# Patient Record
Sex: Male | Born: 1980 | Race: White | Hispanic: No | Marital: Married | Smoking: Current every day smoker
Health system: Southern US, Community
[De-identification: ages and names within clinical notes are randomized; demographics above are authoritative.]

---

## 2012-10-11 ENCOUNTER — Emergency Department (HOSPITAL_COMMUNITY)
Admission: EM | Admit: 2012-10-11 | Discharge: 2012-10-11 | Disposition: A | Discharge status: Home / Self Care | Payer: BC Managed Care – PPO | Attending: Emergency Medicine | Admitting: Emergency Medicine

## 2012-10-11 ENCOUNTER — Encounter (HOSPITAL_COMMUNITY): Payer: Self-pay | Admitting: Emergency Medicine

## 2012-10-11 ENCOUNTER — Emergency Department (HOSPITAL_COMMUNITY): Payer: BC Managed Care – PPO

## 2012-10-11 DIAGNOSIS — Z79899 Other long term (current) drug therapy: Secondary | ICD-10-CM | POA: Insufficient documentation

## 2012-10-11 DIAGNOSIS — M7020 Olecranon bursitis, unspecified elbow: Secondary | ICD-10-CM | POA: Insufficient documentation

## 2012-10-11 DIAGNOSIS — M7022 Olecranon bursitis, left elbow: Secondary | ICD-10-CM

## 2012-10-11 DIAGNOSIS — F17200 Nicotine dependence, unspecified, uncomplicated: Secondary | ICD-10-CM | POA: Insufficient documentation

## 2012-10-11 MED ORDER — IBUPROFEN 800 MG PO TABS: 800.0000 mg | ORAL_TABLET | Freq: Three times a day (TID) | ORAL | Status: DC | PRN

## 2012-10-11 MED ORDER — HYDROCODONE-ACETAMINOPHEN 5-325 MG PO TABS: 1.0000 | ORAL_TABLET | Freq: Four times a day (QID) | ORAL | Status: DC | PRN

## 2012-10-11 NOTE — ED Notes (Signed)
PT states that at about 2 AM he woke up and his elbow was red and tender. PT rates pain as 10/10.  PT states that his fingers and hand is "tingly" and numb. Pt is able to move fingers and wrist and elbow joint in full ROM but with 10/10 pain. Pt states that he was at the urgent care on [address] and they told him to come here for antibiotics.

## 2012-10-11 NOTE — ED Provider Notes (Signed)
CSN: [id number]     Arrival date & time 10/11/12  1648 History   First MD Initiated Contact with Patient 10/11/12 1750     Chief Complaint  Patient presents with  . Elbow Pain   (Consider location/radiation/quality/duration/timing/severity/associated sxs/prior Treatment) HPI The patient presents to the emergency department with left for pain and swelling that began 2 days ago. Patient states he had taken a two-week break from lifting weights and lift weights  on Thursday the patient, states, that he did not have any fall or direct blow to the elbow.  Patient, states, that he has not had a fever, nausea, vomiting, dizziness, weakness, numbness, headache, blurred vision, chest pain, shortness of breath, or wounds.  The patient, states, that he was seen at an urgent care and sent here for concerns with joint infection History reviewed. No pertinent past medical history. History reviewed. No pertinent past surgical history. History reviewed. No pertinent family history. History  Substance Use Topics  . Smoking status: Current Every Day Smoker  . Smokeless tobacco: Not on file  . Alcohol Use: No    Review of Systems All other systems negative except as documented in the HPI. All pertinent positives and negatives as reviewed in the HPI. Allergies  Review of patient's allergies indicates no known allergies.  Home Medications   Current Outpatient Rx  Name  Route  Sig  Dispense  Refill  . amphetamine-dextroamphetamine (ADDERALL XR) 20 MG 24 hr capsule   Oral   Take 20 mg by mouth daily.         . Aspirin-Acetaminophen-Caffeine (GOODY HEADACHE PO)   Oral   Take 1 packet by mouth daily as needed (for pain).         . calcium carbonate (TUMS - DOSED IN MG ELEMENTAL CALCIUM) 500 MG chewable tablet   Oral   Chew 1 tablet by mouth daily as needed for heartburn.         Marland Kitchen ibuprofen (ADVIL,MOTRIN) 200 MG tablet   Oral   Take 400 mg by mouth every 6 (six) hours as needed for pain.           BP 134/79  Pulse 80  Temp(Src) 98 F (36.7 C) (Oral)  Resp 20  SpO2 97% Physical Exam  Nursing note and vitals reviewed. Constitutional: He appears well-developed and well-nourished. No distress.  HENT:  Head: Normocephalic and atraumatic.  Pulmonary/Chest: Effort normal.  Musculoskeletal:       Left elbow: He exhibits swelling and effusion. He exhibits normal range of motion, no deformity and no laceration. Tenderness found. Olecranon process tenderness noted.  Patient has redness and swelling over the olecranon bursa patient does not have any wounds or swelling to any other part of the elbow joint.  Patient has full range of motion of the elbow without significant difficulty.  Patient does complain it hurts to move the elbow, but is able to do so  Skin: Skin is warm and dry. No rash noted. There is erythema.    ED Course  Procedures (including critical care time) Labs Review Labs Reviewed - No data to display Imaging Review Dg Elbow Complete Left  10/11/2012   CLINICAL DATA:  Left elbow pain/swelling  EXAM: LEFT ELBOW - COMPLETE 3+ VIEW  COMPARISON:  None.  FINDINGS: No fracture or dislocation is seen.  The joint spaces are preserved.  No displaced quadrant fat pad suggests an elbow joint effusion.  Soft tissue swelling overlying the olecranon, suggesting olecranon bursitis.  IMPRESSION: No fracture  or dislocation is seen.  Soft tissue swelling overlying the olecranon, suggesting olecranon bursitis.   Electronically Signed   By: Charline Bills M.D.   On: 10/11/2012 18:14   Based on the patient's physical exam history of present illness.  He most likely has olecranon bursitis.  The patient is advised to return here as needed and given him followup with orthopedics.  Told to ice the, elbow.  He'll be placed in an Ace wrap, as well.  Patient is not exhibiting any signs of septic joint. MDM      Carlyle Dolly, PA-C 10/11/12 [phone]

## 2012-10-11 NOTE — ED Notes (Signed)
Patient transported to X-ray 

## 2012-10-11 NOTE — ED Notes (Signed)
Pt here with left elbow redness and swelling starting this am at 1am

## 2012-10-11 NOTE — ED Provider Notes (Signed)
Medical screening examination/treatment/procedure(s) were performed by non-physician practitioner and as supervising physician I was immediately available for consultation/collaboration.   Charles B. Bernette Mayers, MD 10/11/12 [id number]

## 2013-04-15 ENCOUNTER — Encounter (HOSPITAL_BASED_OUTPATIENT_CLINIC_OR_DEPARTMENT_OTHER): Payer: Self-pay | Admitting: Emergency Medicine

## 2013-04-15 ENCOUNTER — Emergency Department (HOSPITAL_BASED_OUTPATIENT_CLINIC_OR_DEPARTMENT_OTHER)
Admission: EM | Admit: 2013-04-15 | Discharge: 2013-04-15 | Disposition: A | Discharge status: Home / Self Care | Payer: Worker's Compensation | Attending: Emergency Medicine | Admitting: Emergency Medicine

## 2013-04-15 DIAGNOSIS — Y9289 Other specified places as the place of occurrence of the external cause: Secondary | ICD-10-CM | POA: Insufficient documentation

## 2013-04-15 DIAGNOSIS — Y9389 Activity, other specified: Secondary | ICD-10-CM | POA: Insufficient documentation

## 2013-04-15 DIAGNOSIS — Z79899 Other long term (current) drug therapy: Secondary | ICD-10-CM | POA: Insufficient documentation

## 2013-04-15 DIAGNOSIS — Z791 Long term (current) use of non-steroidal anti-inflammatories (NSAID): Secondary | ICD-10-CM | POA: Insufficient documentation

## 2013-04-15 DIAGNOSIS — S0500XA Injury of conjunctiva and corneal abrasion without foreign body, unspecified eye, initial encounter: Secondary | ICD-10-CM

## 2013-04-15 DIAGNOSIS — W208XXA Other cause of strike by thrown, projected or falling object, initial encounter: Secondary | ICD-10-CM | POA: Insufficient documentation

## 2013-04-15 DIAGNOSIS — F17200 Nicotine dependence, unspecified, uncomplicated: Secondary | ICD-10-CM | POA: Insufficient documentation

## 2013-04-15 DIAGNOSIS — S058X9A Other injuries of unspecified eye and orbit, initial encounter: Secondary | ICD-10-CM | POA: Insufficient documentation

## 2013-04-15 MED ORDER — TETRACAINE HCL 0.5 % OP SOLN
2.0000 [drp] | Freq: Once | OPHTHALMIC | Status: AC
  Administered 2013-04-15: 2 [drp] via OPHTHALMIC

## 2013-04-15 MED ORDER — NAPROXEN 500 MG PO TABS: 500.0000 mg | ORAL_TABLET | Freq: Two times a day (BID) | ORAL | Status: DC

## 2013-04-15 MED ORDER — FLUORESCEIN SODIUM 1 MG OP STRP
ORAL_STRIP | OPHTHALMIC | Status: AC
  Filled 2013-04-15: qty 1

## 2013-04-15 MED ORDER — FLUORESCEIN SODIUM 1 MG OP STRP
1.0000 | ORAL_STRIP | Freq: Once | OPHTHALMIC | Status: AC
  Administered 2013-04-15: 1 via OPHTHALMIC

## 2013-04-15 MED ORDER — TETRACAINE HCL 0.5 % OP SOLN
OPHTHALMIC | Status: AC
  Filled 2013-04-15: qty 2

## 2013-04-15 MED ORDER — CIPROFLOXACIN HCL 0.3 % OP SOLN: 2.0000 [drp] | OPHTHALMIC | Status: DC

## 2013-04-15 NOTE — ED Provider Notes (Signed)
CSN: [id number]     Arrival date & time 04/15/13  0541 History   First MD Initiated Contact with Patient 04/15/13 [phone]     Chief Complaint  Patient presents with  . Eye Pain     (Consider location/radiation/quality/duration/timing/severity/associated sxs/prior Treatment) Patient is a 33 y.o. male presenting with eye pain. The history is provided by the patient. No language interpreter was used.  Eye Pain This is a new problem. The current episode started 3 to 5 hours ago. The problem occurs constantly. The problem has not changed since onset.Pertinent negatives include no chest pain, no abdominal pain, no headaches and no shortness of breath. Nothing aggravates the symptoms. Nothing relieves the symptoms. He has tried nothing for the symptoms. The treatment provided no relief.    History reviewed. No pertinent past medical history. History reviewed. No pertinent past surgical history. History reviewed. No pertinent family history. History  Substance Use Topics  . Smoking status: Current Every Day Smoker  . Smokeless tobacco: Not on file  . Alcohol Use: No    Review of Systems  Eyes: Positive for pain. Negative for discharge and visual disturbance.  Respiratory: Negative for shortness of breath.   Cardiovascular: Negative for chest pain.  Gastrointestinal: Negative for abdominal pain.  Neurological: Negative for headaches.  All other systems reviewed and are negative.      Allergies  Review of patient's allergies indicates no known allergies.  Home Medications   Current Outpatient Rx  Name  Route  Sig  Dispense  Refill  . amphetamine-dextroamphetamine (ADDERALL XR) 20 MG 24 hr capsule   Oral   Take 20 mg by mouth daily.         . Aspirin-Acetaminophen-Caffeine (GOODY HEADACHE PO)   Oral   Take 1 packet by mouth daily as needed (for pain).         . calcium carbonate (TUMS - DOSED IN MG ELEMENTAL CALCIUM) 500 MG chewable tablet   Oral   Chew 1 tablet by mouth  daily as needed for heartburn.         . ciprofloxacin (CILOXAN) 0.3 % ophthalmic solution   Right Eye   Place 2 drops into the right eye every 4 (four) hours while awake. Administer 1 drop, every 2 hours, while awake, for 2 days. Then 1 drop, every 4 hours, while awake, for the next 5 days.   5 mL   0   . HYDROcodone-acetaminophen (NORCO/VICODIN) 5-325 MG per tablet   Oral   Take 1 tablet by mouth every 6 (six) hours as needed for pain.   20 tablet   0   . ibuprofen (ADVIL,MOTRIN) 200 MG tablet   Oral   Take 400 mg by mouth every 6 (six) hours as needed for pain.         Marland Kitchen ibuprofen (ADVIL,MOTRIN) 800 MG tablet   Oral   Take 1 tablet (800 mg total) by mouth every 8 (eight) hours as needed for pain.   21 tablet   0   . naproxen (NAPROSYN) 500 MG tablet   Oral   Take 1 tablet (500 mg total) by mouth 2 (two) times daily.   30 tablet   0    BP 135/87  Pulse 92  Temp(Src) 98 F (36.7 C) (Oral)  Resp 18  Ht 6' (1.829 m)  Wt 185 lb (83.915 kg)  BMI 25.08 kg/m2 Physical Exam  Constitutional: He is oriented to person, place, and time. He appears well-developed and well-nourished. No distress.  HENT:  Head: Normocephalic and atraumatic.  Mouth/Throat: Oropharynx is clear and moist.  Eyes: EOM are normal. Pupils are equal, round, and reactive to light. Right eye exhibits no chemosis. Left eye exhibits no chemosis.  Slit lamp exam:      The right eye shows corneal abrasion. The right eye shows no foreign body and no hyphema.       The left eye shows no corneal abrasion.  Neck: Normal range of motion. Neck supple.  Cardiovascular: Normal rate and regular rhythm.   Pulmonary/Chest: Effort normal and breath sounds normal. He has no wheezes.  Abdominal: Bowel sounds are normal. He exhibits no distension.  Musculoskeletal: Normal range of motion.  Neurological: He is alert and oriented to person, place, and time.  Skin: Skin is warm and dry.  Psychiatric: He has a normal  mood and affect.    ED Course  Procedures (including critical care time) Labs Review Labs Reviewed - No data to display Imaging Review No results found.   EKG Interpretation None      MDM   Final diagnoses:  Corneal abrasion  Corneal abrasion Tetanus UTD  Cipro eye drops q 4 hrs while awake x 7 days and follow up in 24 hours with Gwen PoundsKowalski or ophtho.      Jasmine AweApril K Shandra Szymborski-Rasch, MD 04/15/13 [phone]

## 2013-04-15 NOTE — Discharge Instructions (Signed)
Corneal Abrasion  The cornea is the clear covering at the front and center of the eye. When looking at the colored portion of the eye (iris), you are looking through the cornea. This very thin tissue is made up of many layers. The surface layer is a single layer of cells (corneal epithelium) and is one of the most sensitive tissues in the body. If a scratch or injury causes the corneal epithelium to come off, it is called a corneal abrasion. If the injury extends to the tissues below the epithelium, the condition is called a corneal ulcer.  CAUSES    Scratches.   Trauma.   Foreign body in the eye.  Some people have recurrences of abrasions in the area of the original injury even after it has healed (recurrent erosion syndrome). Recurrent erosion syndrome generally improves and goes away with time.  SYMPTOMS    Eye pain.   Difficulty or inability to keep the injured eye open.   The eye becomes very sensitive to light.   Recurrent erosions tend to happen suddenly, first thing in the morning, usually after waking up and opening the eye.  DIAGNOSIS   Your health care provider can diagnose a corneal abrasion during an eye exam. Dye is usually placed in the eye using a drop or a small paper strip moistened by your tears. When the eye is examined with a special light, the abrasion shows up clearly because of the dye.  TREATMENT    Small abrasions may be treated with antibiotic drops or ointment alone.   Usually a pressure patch is specially applied. Pressure patches prevent the eye from blinking, allowing the corneal epithelium to heal. A pressure patch also reduces the amount of pain present in the eye during healing. Most corneal abrasions heal within 2 3 days with no effect on vision.  If the abrasion becomes infected and spreads to the deeper tissues of the cornea, a corneal ulcer can result. This is serious because it can cause corneal scarring. Corneal scars interfere with light passing through the cornea  and cause a loss of vision in the involved eye.  HOME CARE INSTRUCTIONS   Use medicine or ointment as directed. Only take over-the-counter or prescription medicines for pain, discomfort, or fever as directed by your health care provider.   Do not drive or operate machinery while your eye is patched. Your ability to judge distances is impaired.   If your health care provider has given you a follow-up appointment, it is very important to keep that appointment. Not keeping the appointment could result in a severe eye infection or permanent loss of vision. If there is any problem keeping the appointment, let your health care provider know.  SEEK MEDICAL CARE IF:    You have pain, light sensitivity, and a scratchy feeling in one eye or both eyes.   Your pressure patch keeps loosening up, and you can blink your eye under the patch after treatment.   Any kind of discharge develops from the eye after treatment or if the lids stick together in the morning.   You have the same symptoms in the morning as you did with the original abrasion days, weeks, or months after the abrasion healed.  MAKE SURE YOU:    Understand these instructions.   Will watch your condition.   Will get help right away if you are not doing well or get worse.  Document Released: 01/06/2000 Document Revised: 10/29/2012 Document Reviewed: 09/15/2012  [employer] Patient Information   2014 [employer]

## 2013-04-15 NOTE — ED Notes (Signed)
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

## 2013-08-24 ENCOUNTER — Emergency Department (HOSPITAL_COMMUNITY)
Admission: EM | Admit: 2013-08-24 | Discharge: 2013-08-24 | Disposition: A | Discharge status: Home / Self Care | Payer: Worker's Compensation | Attending: Emergency Medicine | Admitting: Emergency Medicine

## 2013-08-24 ENCOUNTER — Encounter (HOSPITAL_COMMUNITY): Payer: Self-pay | Admitting: Emergency Medicine

## 2013-08-24 DIAGNOSIS — Z791 Long term (current) use of non-steroidal anti-inflammatories (NSAID): Secondary | ICD-10-CM | POA: Insufficient documentation

## 2013-08-24 DIAGNOSIS — T148XXA Other injury of unspecified body region, initial encounter: Secondary | ICD-10-CM

## 2013-08-24 DIAGNOSIS — Z79899 Other long term (current) drug therapy: Secondary | ICD-10-CM | POA: Diagnosis not present

## 2013-08-24 DIAGNOSIS — W268XXA Contact with other sharp object(s), not elsewhere classified, initial encounter: Secondary | ICD-10-CM | POA: Diagnosis not present

## 2013-08-24 DIAGNOSIS — S71009A Unspecified open wound, unspecified hip, initial encounter: Secondary | ICD-10-CM | POA: Diagnosis present

## 2013-08-24 DIAGNOSIS — S7010XA Contusion of unspecified thigh, initial encounter: Secondary | ICD-10-CM | POA: Insufficient documentation

## 2013-08-24 DIAGNOSIS — S7011XA Contusion of right thigh, initial encounter: Secondary | ICD-10-CM

## 2013-08-24 DIAGNOSIS — F17200 Nicotine dependence, unspecified, uncomplicated: Secondary | ICD-10-CM | POA: Insufficient documentation

## 2013-08-24 DIAGNOSIS — S71109A Unspecified open wound, unspecified thigh, initial encounter: Secondary | ICD-10-CM | POA: Diagnosis not present

## 2013-08-24 DIAGNOSIS — Y990 Civilian activity done for income or pay: Secondary | ICD-10-CM | POA: Insufficient documentation

## 2013-08-24 DIAGNOSIS — Y9289 Other specified places as the place of occurrence of the external cause: Secondary | ICD-10-CM | POA: Insufficient documentation

## 2013-08-24 LAB — CBC WITH DIFFERENTIAL/PLATELET
Basophils Absolute: 0 10*3/uL (ref 0.0–0.1)
Basophils Relative: 0 % (ref 0–1)
EOS PCT: 3 % (ref 0–5)
Eosinophils Absolute: 0.4 10*3/uL (ref 0.0–0.7)
HCT: 46 % (ref 39.0–52.0)
HEMOGLOBIN: 16.3 g/dL (ref 13.0–17.0)
Lymphocytes Relative: 29 % (ref 12–46)
Lymphs Abs: 3.9 10*3/uL (ref 0.7–4.0)
MCH: 30.9 pg (ref 26.0–34.0)
MCHC: 35.4 g/dL (ref 30.0–36.0)
MCV: 87.1 fL (ref 78.0–100.0)
MONO ABS: 1.3 10*3/uL — AB (ref 0.1–1.0)
MONOS PCT: 10 % (ref 3–12)
NEUTROS ABS: 8 10*3/uL — AB (ref 1.7–7.7)
Neutrophils Relative %: 58 % (ref 43–77)
Platelets: 262 10*3/uL (ref 150–400)
RBC: 5.28 MIL/uL (ref 4.22–5.81)
RDW: 12.5 % (ref 11.5–15.5)
WBC: 13.7 10*3/uL — AB (ref 4.0–10.5)

## 2013-08-24 LAB — BASIC METABOLIC PANEL
Anion gap: 20 — ABNORMAL HIGH (ref 5–15)
BUN: 13 mg/dL (ref 6–23)
CHLORIDE: 98 meq/L (ref 96–112)
CO2: 20 mEq/L (ref 19–32)
Calcium: 10.3 mg/dL (ref 8.4–10.5)
Creatinine, Ser: 1.07 mg/dL (ref 0.50–1.35)
GFR calc Af Amer: >90 mL/min (ref >90)
GFR calc non Af Amer: >90 mL/min (ref >90)
GLUCOSE: 106 mg/dL — AB (ref 70–99)
Potassium: 4.1 mEq/L (ref 3.7–5.3)
Sodium: 138 mEq/L (ref 137–147)

## 2013-08-24 MED ORDER — SODIUM CHLORIDE 0.9 % IV BOLUS (SEPSIS)
1000.0000 mL | Freq: Once | INTRAVENOUS | Status: AC
  Administered 2013-08-24: 1000 mL via INTRAVENOUS

## 2013-08-24 MED ORDER — LORAZEPAM 2 MG/ML IJ SOLN
1.0000 mg | Freq: Once | INTRAMUSCULAR | Status: AC
  Administered 2013-08-24: 1 mg via INTRAVENOUS
  Filled 2013-08-24: qty 1

## 2013-08-24 MED ORDER — NAPROXEN 500 MG PO TABS: 500.0000 mg | ORAL_TABLET | Freq: Two times a day (BID) | ORAL | Status: DC

## 2013-08-24 MED ORDER — ONDANSETRON HCL 4 MG/2ML IJ SOLN
4.0000 mg | Freq: Once | INTRAMUSCULAR | Status: AC
  Administered 2013-08-24: 4 mg via INTRAVENOUS
  Filled 2013-08-24: qty 2

## 2013-08-24 MED ORDER — HYDROCODONE-ACETAMINOPHEN 5-325 MG PO TABS: ORAL_TABLET | ORAL | Status: DC

## 2013-08-24 NOTE — ED Provider Notes (Signed)
Medical screening examination/treatment/procedure(s) were performed by non-physician practitioner and as supervising physician I was immediately available for consultation/collaboration.  Flint MelterElliott L Rylee Huestis, MD 08/24/13 [phone]

## 2013-08-24 NOTE — ED Notes (Signed)
Declined W/C at D/C and was escorted to lobby by RN. 

## 2013-08-24 NOTE — ED Provider Notes (Signed)
CSN: [id number]     Arrival date & time 08/24/13  1117 History  This chart was scribed for non-physician practitioner, Rhea Bleacher, PA-C working with Flint Melter, MD, by Jarvis Morgan, ED Scribe. This patient was seen in room TR10C/TR10C and the patient's care was started at 12:02 PM.    Chief Complaint  Patient presents with  . Laceration    The history is provided by the patient. No language interpreter was used.   HPI Comments: Brandon Brock is a 33 y.o. male who presents to the Emergency Department complaining of laceration to the outer side of his right leg. 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 Patient works for [employer]. Patient is unsure whether the blade may have been dirty or had debris on it. Unsure on how deep the cut was. The laceration is actively bleeding but it currently controlled by gauze. He denies being on any blood thinners. Last t-dap was 1 year ago. He denies any dizziness, numbness or weakness to his right leg.   History reviewed. No pertinent past medical history. History reviewed. No pertinent past surgical history. No family history on file. History  Substance Use Topics  . Smoking status: Current Every Day Smoker  . Smokeless tobacco: Not on file  . Alcohol Use: No    Review of Systems  Constitutional: Negative for activity change.  Musculoskeletal: Positive for arthralgias. Negative for back pain, gait problem, joint swelling and neck pain.  Skin: Positive for wound (laceration to right leg, bleeding controlled by gauze).  Neurological: Negative for dizziness, weakness and numbness.    Allergies  Review of patient's allergies indicates no known allergies.  Home Medications   Prior to Admission medications   Medication Sig Start Date End Date Taking? Authorizing Provider  amphetamine-dextroamphetamine (ADDERALL XR) 20 MG 24 hr capsule Take 20 mg by mouth daily.    Historical Provider, MD   Aspirin-Acetaminophen-Caffeine (GOODY HEADACHE PO) Take 1 packet by mouth daily as needed (for pain).    Historical Provider, MD  calcium carbonate (TUMS - DOSED IN MG ELEMENTAL CALCIUM) 500 MG chewable tablet Chew 1 tablet by mouth daily as needed for heartburn.    Historical Provider, MD  ciprofloxacin (CILOXAN) 0.3 % ophthalmic solution Place 2 drops into the right eye every 4 (four) hours while awake. Administer 1 drop, every 2 hours, while awake, for 2 days. Then 1 drop, every 4 hours, while awake, for the next 5 days. 04/15/13   April Smitty Cords, MD  HYDROcodone-acetaminophen (NORCO/VICODIN) 5-325 MG per tablet Take 1 tablet by mouth every 6 (six) hours as needed for pain. 10/11/12   Jamesetta Orleans Lawyer, PA-C  ibuprofen (ADVIL,MOTRIN) 200 MG tablet Take 400 mg by mouth every 6 (six) hours as needed for pain.    Historical Provider, MD  ibuprofen (ADVIL,MOTRIN) 800 MG tablet Take 1 tablet (800 mg total) by mouth every 8 (eight) hours as needed for pain. 10/11/12   Jamesetta Orleans Lawyer, PA-C  naproxen (NAPROSYN) 500 MG tablet Take 1 tablet (500 mg total) by mouth 2 (two) times daily. 04/15/13   April K Palumbo-Rasch, MD   Triage Vitals:BP 144/84  Pulse 93  Temp(Src) 98.9 F (37.2 C) (Oral)  Resp 16  Ht 6\' 1"  (1.854 m)  Wt 190 lb (86.183 kg)  BMI 25.07 kg/m2  SpO2 97%  Physical Exam  Nursing note and vitals reviewed. Constitutional: He is oriented to person, place, and time. He appears well-developed  and well-nourished. No distress.  HENT:  Head: Normocephalic and atraumatic.  Eyes: Conjunctivae and EOM are normal.  Neck: Normal range of motion. Neck supple. No tracheal deviation present.  Cardiovascular: Normal rate and normal pulses.   Pulmonary/Chest: Effort normal. No respiratory distress.  Musculoskeletal: Normal range of motion. He exhibits tenderness. He exhibits no edema.  Neurological: He is alert and oriented to person, place, and time. No sensory deficit.  Motor,  sensation, and vascular distal to the injury is fully intact.   Skin: Skin is warm and dry.  1cm puncture wound noted R lateral thigh. There is moderate hematoma noted. No active bleeding. Wound extends approximately 1.5 cm in depth. No FB seen or palpated.   Psychiatric: He has a normal mood and affect. His behavior is normal.    ED Course  Procedures (including critical care time)  DIAGNOSTIC STUDIES: Oxygen Saturation is 97% on RA, normal by my interpretation.    COORDINATION OF CARE:  12:09 PM  LACERATION REPAIR Performed by: Trula SladeGeiple PA-C, Sykes PA-S2 Consent: Verbal consent obtained. Risks and benefits: risks, benefits and alternatives were discussed Patient identity confirmed: provided demographic data Time out performed prior to procedure Prepped and Draped in normal sterile fashion Wound explored Laceration Location: R lateral thigh Laceration Length: 1cm No Foreign Bodies seen or palpated Anesthesia: local infiltration Local anesthetic: lidocaine 2% with epinephrine Anesthetic total: 2.5 ml Irrigation method: syringe with splash guard, 750cc NS Amount of cleaning: standard Skin closure: 5-0 Ethilon Number of sutures or staples: 2 Technique: simple interrupted, loose Patient tolerance: Patient tolerated the procedure well with no immediate complications.  After starting wound cleaning and repair, patient had a vasovagal episode followed by a panic attack. Patient was breathing very heavily and had diaphoresis. At no point did he have evidence of angioedema or any signs of anaphylaxis. Patient was placed on a monitor and vital signs were monitored. Basic labs and fluids were ordered. Ativan and Zofran were given. Patient improved over 15 minutes.  Patient improved and wound repair was completed.  Patient counseled on wound care. Patient counseled on need to return or see PCP/urgent care for suture removal in 7 days. Patient was urged to return to the Emergency  Department urgently with worsening pain, swelling, expanding erythema especially if it streaks away from the affected area, fever, or if they have any other concerns. Patient verbalized understanding.   Patient counseled on use of narcotic pain medications. Counseled not to combine these medications with others containing tylenol. Urged not to drink alcohol, drive, or perform any other activities that requires focus while taking these medications. The patient verbalizes understanding and agrees with the plan.  Hematoma was monitored during a several hour ED stay. Hematoma did not expand and appears stable at current time. Wound dressed with a pressure bandage.    Labs Review Labs Reviewed - No data to display  Imaging Review No results found.   EKG Interpretation None      Vital signs reviewed and are as follows: Filed Vitals:   08/24/13 1350  BP: 122/76  Pulse: 75  Temp: 98.8 F (37.1 C)  Resp: 16     MDM   Final diagnoses:  Puncture wound  Hematoma of thigh, right, initial encounter   Puncture wound: Wound explored and repaired. No foreign bodies visualized or palpated. Tetanus is up-to-date.  Hematoma: Tender and painful, however stable during ED stay. Hematoma was monitored for several hours. No vascular compromise of lower extremity. Patient counseled to use  warm compresses and pain medications as needed.   Labs reviewed and are as expected and unremarkable. Patient improved after IV medications and fluids.   I personally performed the services described in this documentation, which was scribed in my presence. The recorded information has been reviewed and is accurate.     Renne Crigler, PA-C 08/24/13 1425

## 2013-08-24 NOTE — Discharge Instructions (Signed)
Please read and follow all provided instructions.  Your diagnoses today include:  1. Puncture wound   2. Hematoma of thigh, right, initial encounter     Tests performed today include:  Vital signs. See below for your results today.   Medications prescribed:   Vicodin (hydrocodone/acetaminophen) - narcotic pain medication  DO NOT drive or perform any activities that require you to be awake and alert because this medicine can make you drowsy. BE VERY CAREFUL not to take multiple medicines containing Tylenol (also called acetaminophen). Doing so can lead to an overdose which can damage your liver and cause liver failure and possibly death.   Naproxen - anti-inflammatory pain medication  Do not exceed 500mg  naproxen every 12 hours, take with food  You have been prescribed an anti-inflammatory medication or NSAID. Take with food. Take smallest effective dose for the shortest duration needed for your pain. Stop taking if you experience stomach pain or vomiting.   Take any prescribed medications only as directed.   Home care instructions:  Follow any educational materials and wound care instructions contained in this packet.   Follow-up instructions: Suture Removal: Return to the Emergency Department or see your primary care care doctor in 7 days for a recheck of your wound and removal of your sutures or staples.    Return instructions:  Return to the Emergency Department if you have:  Fever  Worsening pain  Worsening swelling of the wound  Pus draining from the wound  Redness of the skin that moves away from the wound, especially if it streaks away from the affected area   Any other emergent concerns  Your vital signs today were: BP 122/76   Pulse 75   Temp(Src) 98.8 F (37.1 C) (Oral)   Resp 16   Ht 6\' 1"  (1.854 m)   Wt 190 lb (86.183 kg)   BMI 25.07 kg/m2   SpO2 98% If your blood pressure (BP) was elevated above 135/85 this visit, please have this repeated by your doctor  within one month. --------------

## 2013-08-24 NOTE — ED Notes (Signed)
PA at bedside.

## 2013-08-24 NOTE — ED Notes (Signed)
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

## 2013-08-24 NOTE — ED Notes (Signed)
During repair oF Lac Pt began to shake and was diaphoretic . Pt B/P elevated . Pt placed on monitor and IV site started for fluids and meds.

## 2013-08-31 ENCOUNTER — Emergency Department (HOSPITAL_COMMUNITY): Payer: Worker's Compensation

## 2013-08-31 ENCOUNTER — Emergency Department (HOSPITAL_COMMUNITY)
Admission: EM | Admit: 2013-08-31 | Discharge: 2013-08-31 | Disposition: A | Discharge status: Home / Self Care | Payer: Worker's Compensation | Attending: Emergency Medicine | Admitting: Emergency Medicine

## 2013-08-31 ENCOUNTER — Encounter (HOSPITAL_COMMUNITY): Payer: Self-pay | Admitting: Emergency Medicine

## 2013-08-31 DIAGNOSIS — F17200 Nicotine dependence, unspecified, uncomplicated: Secondary | ICD-10-CM | POA: Diagnosis not present

## 2013-08-31 DIAGNOSIS — Z792 Long term (current) use of antibiotics: Secondary | ICD-10-CM | POA: Diagnosis not present

## 2013-08-31 DIAGNOSIS — Z7982 Long term (current) use of aspirin: Secondary | ICD-10-CM | POA: Insufficient documentation

## 2013-08-31 DIAGNOSIS — IMO0002 Reserved for concepts with insufficient information to code with codable children: Secondary | ICD-10-CM | POA: Diagnosis not present

## 2013-08-31 DIAGNOSIS — Z4802 Encounter for removal of sutures: Secondary | ICD-10-CM | POA: Insufficient documentation

## 2013-08-31 DIAGNOSIS — M25561 Pain in right knee: Secondary | ICD-10-CM

## 2013-08-31 DIAGNOSIS — M25569 Pain in unspecified knee: Secondary | ICD-10-CM | POA: Insufficient documentation

## 2013-08-31 DIAGNOSIS — Z79899 Other long term (current) drug therapy: Secondary | ICD-10-CM | POA: Insufficient documentation

## 2013-08-31 NOTE — ED Provider Notes (Signed)
CSN: [id number]     Arrival date & time 08/31/13  1004 History  This chart was scribed for non-physician practitioner, Mellody DrownLauren Earlie Schank, PA-C working with Gilda Creasehristopher J. Pollina, MD by Greggory StallionKayla Andersen, ED scribe. This patient was seen in room TR09C/TR09C and the patient's care was started at 10:38 AM.   Chief Complaint  Patient presents with  . Suture / Staple Removal   HPI Comments: Brandon Brock is a 33 y.o. male who presents to the Emergency Department for suture removal. Pt had 2 sutures placed to his right thigh 7 days ago. Reports swelling around the area that started an hour after injury, gradually improving. States it has gone down significantly over the last few days. Pt has done warm compresses to his thigh. States he has been having right knee pain that radiates up his right thigh. Bearing weight and bending his knee worsens the pain. Denies fever, chills.  NO PCP  The history is provided by the patient. No language interpreter was used.    History reviewed. No pertinent past medical history. History reviewed. No pertinent past surgical history. No family history on file. History  Substance Use Topics  . Smoking status: Current Every Day Smoker -- 1.00 packs/day    Types: Cigarettes  . Smokeless tobacco: Not on file  . Alcohol Use: No    Review of Systems  Constitutional: Negative for fever and chills.  Gastrointestinal: Negative for nausea and vomiting.  Musculoskeletal: Positive for arthralgias and gait problem.  Skin: Positive for color change and wound.  All other systems reviewed and are negative.  Allergies  Review of patient's allergies indicates no known allergies.  Home Medications   Prior to Admission medications   Medication Sig Start Date End Date Taking? Authorizing Provider  amphetamine-dextroamphetamine (ADDERALL XR) 20 MG 24 hr capsule Take 20 mg by mouth daily.    Historical Provider, MD  Aspirin-Acetaminophen-Caffeine (GOODY HEADACHE PO) Take 1  packet by mouth daily as needed (for pain).    Historical Provider, MD  calcium carbonate (TUMS - DOSED IN MG ELEMENTAL CALCIUM) 500 MG chewable tablet Chew 1 tablet by mouth daily as needed for heartburn.    Historical Provider, MD  ciprofloxacin (CILOXAN) 0.3 % ophthalmic solution Place 2 drops into the right eye every 4 (four) hours while awake. Administer 1 drop, every 2 hours, while awake, for 2 days. Then 1 drop, every 4 hours, while awake, for the next 5 days. 04/15/13   April Smitty CordsK Palumbo-Rasch, MD  HYDROcodone-acetaminophen (NORCO/VICODIN) 5-325 MG per tablet Take 1-2 tablets every 6 hours as needed for severe pain 08/24/13   Renne CriglerJoshua Geiple, PA-C  ibuprofen (ADVIL,MOTRIN) 200 MG tablet Take 400 mg by mouth every 6 (six) hours as needed for pain.    Historical Provider, MD  ibuprofen (ADVIL,MOTRIN) 800 MG tablet Take 1 tablet (800 mg total) by mouth every 8 (eight) hours as needed for pain. 10/11/12   Jamesetta Orleanshristopher W Lawyer, PA-C  naproxen (NAPROSYN) 500 MG tablet Take 1 tablet (500 mg total) by mouth 2 (two) times daily. 08/24/13   Renne CriglerJoshua Geiple, PA-C   BP 129/75  Pulse 99  Temp(Src) 97.5 F (36.4 C) (Oral)  Resp 18  SpO2 99%  Physical Exam  Nursing note and vitals reviewed. Constitutional: He is oriented to person, place, and time. He appears well-developed and well-nourished.  Non-toxic appearance. He does not have a sickly appearance. He does not appear ill. No distress.  HENT:  Head: Normocephalic and atraumatic.  Eyes: Conjunctivae and EOM  are normal.  Neck: Neck supple.  Pulmonary/Chest: Effort normal. No respiratory distress.  Musculoskeletal: Normal range of motion.  Neurological: He is alert and oriented to person, place, and time.  Motor, sensation and vascular distal to injury is fully intact.  Skin: Skin is warm and dry. He is not diaphoretic.  1 cm puncture wound to right lateral thigh. Moderate 3x3 cm hematoma. Scab intact. No surrounding erythema. No drainage. Unable to express  drainage with pressure. Ecchymosis to the posterior lateral thigh. Decrease Right knee ROM secondary to pain and questionable effort. No obvious joint effusion.  Psychiatric: He has a normal mood and affect. His behavior is normal.    ED Course  Procedures (including critical care time)  SUTURE REMOVAL Performed by: Mellody Drown, PA-C Authorized by: Mellody Drown, PA-C Consent: Verbal consent obtained. Consent given by: patient  Required items: required blood products, implants, devices, and special equipment available  Time out: Immediately prior to procedure a "time out" was called to verify the correct patient, procedure, equipment, support staff and site/side marked as required. Location: right lateral thigh Wound Appearance: clean, healing, swollen Sutures Removed: 2 Patient tolerance: Patient tolerated the procedure well with no immediate complications.   COORDINATION OF CARE: 10:41 AM-Discussed treatment plan which includes removing sutures and xray with pt at bedside and pt agreed to plan.   Labs Review Labs Reviewed - No data to display  Imaging Review Dg Knee Complete 4 Views Right  08/31/2013   CLINICAL DATA:  Laceration.  EXAM: RIGHT KNEE - COMPLETE 4+ VIEW  COMPARISON:  None.  FINDINGS: Osseous structures are normal. No radiodense foreign body. No joint effusion.  IMPRESSION: Normal exam.   Electronically Signed   By: Geanie Cooley M.D.   On: 08/31/2013 12:07     EKG Interpretation None      MDM   Final diagnoses:  Visit for suture removal  Right knee pain   Patient presents with suture removal. No obvious sign of infection. Moderate-sized hematoma under laceration wound. Tetanus within one year. Complains of pain with range of motion to right knee, likely do to puncture wound. X-ray to evaluate.  X-ray negative for acute findings. The spleen ordered, elevation and ice encourage Discussed lab results, imaging results, and treatment plan with the patient.  Return precautions given. Reports understanding and no other concerns at this time.  Patient is stable for discharge at this time.    I personally performed the services described in this documentation, which was scribed in my presence. The recorded information has been reviewed and is accurate.  Mellody Drown, PA-C 09/01/13 1531

## 2013-08-31 NOTE — ED Notes (Signed)
Patient states he is here for suture removal from R upper leg.  Patient states area is raised, red and still has a lot of pain associated with area.

## 2013-08-31 NOTE — Discharge Instructions (Signed)
Call for a follow up appointment with a Family or Primary Care Provider.  Call a orthopedic specialist for further evaluation of your knee pain and injury. Return if Symptoms worsen.   Take medication as prescribed.  Continue taking the naproxen as prescribed. Elevate your knee and ice 3-4 times a day.   Emergency Department Resource Guide 1) Find a Doctor and Pay Out of Pocket Although you won't have to find out who is covered by your insurance plan, it is a good idea to ask around and get recommendations. You will then need to call the office and see if the doctor you have chosen will accept you as a new patient and what types of options they offer for patients who are self-pay. Some doctors offer discounts or will set up payment plans for their patients who do not have insurance, but you will need to ask so you aren't surprised when you get to your appointment.  2) Contact Your Local Health Department Not all health departments have doctors that can see patients for sick visits, but many do, so it is worth a call to see if yours does. If you don't know where your local health department is, you can check in your phone book. The CDC also has a tool to help you locate your state's health department, and many state websites also have listings of all of their local health departments.  3) Find a Walk-in Clinic If your illness is not likely to be very severe or complicated, you may want to try a walk in clinic. These are popping up all over the country in pharmacies, drugstores, and shopping centers. They're usually staffed by nurse practitioners or physician assistants that have been trained to treat common illnesses and complaints. They're usually fairly quick and inexpensive. However, if you have serious medical issues or chronic medical problems, these are probably not your best option.  No Primary Care Doctor: - Call Health Connect at  [phone] - they can help you locate a primary care doctor  that  accepts your insurance, provides certain services, etc. - Physician Referral Service- [phone]  Chronic Pain Problems: Organization         Address  Phone   Notes  [facility]  [phone] Patients need to be referred by their primary care doctor.   Medication Assistance: Organization         Address  Phone   Notes  [facility] Medication [facility] [address] [place], [place] [address] [phone] --Must be a resident of [place] -- Must have NO insurance coverage whatsoever (no Medicaid/ Medicare, etc.) -- The pt. MUST have a primary care doctor that directs their care regularly and follows them in the community   MedAssist  [phone]   [employer]  [phone]    Agencies that provide inexpensive medical care: Organization         Address  Phone   Notes  [facility]  [phone]   [facility]    [phone]   [facility] [address] [place], [place] [address] [phone]   [facility] [address], [place] [phone]   Planned Parenthood    [phone]   [facility]    [phone]   [facility]  [address], [place] Phone:  [phone], Fax:  [phone] Hours of  Operation:  9 am - 6 pm, M-F.  Also accepts Medicaid/Medicare and self-pay.  [facility] [place], [address], [place] Phone: [phone], Fax: [phone]. Hours of Operation:  8:30 am - 5:30 pm, M-F.  Also accepts Medicaid and self-pay.  [facility] [place] [address] [place] Phone: [phone]   [place], [place], [place] [phone], Ext. [phone] Mondays & Thursdays: 7-9 AM.  First 15 patients are seen on a first come, first serve basis.    [place] Providers:  Organization          Address  Phone   Notes  [facility] [address], [place] [phone] Also accepts self-pay patients.  [facility] [id number] [place], [place]  [phone]   [place], [address], [place] [phone]   [facility] [address], [place] [phone]   Lucianne Lei [address], [place]   [phone] Only accepts [place] Access [place] patients after they have their name applied to their card.   Self-Pay (no insurance) in [facility]:  Organization         Address  Phone   Notes  Sickle Cell Patients, [facility] [place] [phone]   [facility] [place] [phone]   [facility] [place]  [place], [place], [place] [phone]   [facility]/Dr. Osei-Bonsu  [address] [place] or [address], [place] [phone] Phone number for both [place] and [place] locations is the same.  Urgent Medical and [facility] [address] [place] [phone]   [facility] [address], [place] or [address] [phone]   [facility] [address] [place] [phone], phone; [phone], fax Sees patients 1st and 3rd Saturday of every month.  Must not qualify for public or private insurance (i.e. Medicaid, Medicare, [place] Health Choice, Veterans' Benefits)  Household income should be no more than 200% of the poverty level The clinic cannot treat you if you are pregnant or think you are pregnant  Sexually transmitted diseases are not treated at the clinic.    Dental Care: Organization         Address  Phone  Notes  [facility] Department of [facility] [place] [phone] Accepts children up to age 83 who are enrolled in [place] or [place]; pregnant women with a Medicaid card; and children who have applied for Medicaid or [place] Health Choice, but were declined, whose parents can pay a reduced fee at time of service.  [facility] Department of [facility]  [address], [place] [phone] Accepts children up to age 32 who are enrolled in [place] or [place]; pregnant women with a Medicaid card; and children who have applied for Medicaid or [place] Health Choice, but were declined, whose parents can pay a reduced fee at time of service.  [place] Adult Dental Access PROGRAM  [place] [phone] Patients are seen by appointment only. Walk-ins are not accepted. [place] will see patients  36 years of age and older. Monday - Tuesday (8am-5pm) Most Wednesdays (8:30-5pm) $30 per visit, cash only  [facility] Adult Dental Access PROGRAM  [address], [facility] [phone] Patients are seen by appointment only. Walk-ins are not accepted. [place] will see patients 34 years of age and older. One Wednesday Evening (Monthly: Volunteer Based).  $30 per visit, cash only  [place]  [phone] for adults; Children under age 73, call Graduate Pediatric Dentistry at [phone]. Children aged 46-14, please call [phone] to request a pediatric application.  Dental services are provided in all areas of dental care including fillings, crowns and bridges, complete and partial dentures, implants, gum treatment, root canals, and extractions. Preventive care is also provided. Treatment is provided to both adults and children. Patients are selected via a lottery and there is often a waiting list.   [facility] [address] [place]  [phone] [email or web address]   [facility] [address] [place], [place]  [phone], Ext. [phone] Second and Fourth Thursday of each month, opens at 6:30 AM; Clinic ends at 9 AM.  Patients are seen on a first-come first-served basis, and a limited number are seen during each clinic.   [facility]  [address] Hillard Danker [place], [place] [phone]   Eligibility Requirements You must have lived in [place], [place], or [place] counties for at least the last three months.   You cannot be eligible for state or federal sponsored [employer], including [employer], [place], or [employer].   You generally cannot be eligible for healthcare insurance through your employer.    How to apply: Eligibility screenings are held every Tuesday and Wednesday afternoon from 1:00 pm until 4:00 pm. You do not need an appointment for the interview!  [facility] [address] [place], [place]   [place]  [place] Department  [place]  [phone]    Behavioral Health Resources in the Community: Intensive Outpatient Programs Organization         Address  Phone  Notes  [facility] [place]. [address] [place], [place] [phone]   [facility] Outpatient [address], [place], [place]   ADS: Alcohol & Drug Svcs [address], [place], [place]   [place] [address]  [place], [place] or [phone]   Substance Abuse Resources Organization         Address  Phone  Notes  Alcohol and Drug Services  [phone]   [place]  [phone]   [facility] [place]   Chinita Pester  [phone]   Residential & Outpatient Substance Abuse Program  [phone]   Psychological Services Organization         Address  Phone  Notes  [facility] [place]  [place]  [phone]    [place] [address], [place] or [phone]    Mobile Crisis Teams Organization         Address  Phone  Notes  [facility]atives, Mobile Crisis Care Unit  [phone]   [facility]ive Psychotherapeutic Services  [address] [place], [place]   [facility] [address] [place] [phone]    Self-Help/Support Groups Organization  Address  Phone             Notes  [place]. of [place] - variety of support groups  [place] Call for more information  Narcotics Anonymous (NA), [facility] [address], [employer] [place]  2 meetings at this location   Special educational needs teacher         Address  Phone  Notes  ASAP Residential Treatment [place],    [place]  [phone])[phone]   [facility]  [address] [place] 996924, [place], [place]   [place], [place] [phone] Admissions: 8am-3pm M-F  Incentives Substance [place] [address]    [place], [place] [phone]   [facility] [address] [place], [place], [place]   [facility] [address]  [place], [place]   [facility]ight Programs - Intensive Outpatient [address], [place], [place]   [facility] ([place] [address]  [place], [place] [phone] or [phone]   [facility]idential Treatment Services (RTS) [address] [place], [place] Accepts Medicaid  [facility] [place] [address]  [place] [phone] Substance Abuse/Addiction Treatment   [facility] Organization         Address  Phone  Notes  [facility]  [phone]   Domenic Schwab, PhD [address] Arlis Porta [place], [place]   [phone] or [phone]   [place]  [place], [place] [phone]   [facility] [address], [place], [place] [phone] Insurance/Medicaid/sponsorship through [facility] and Families [address] [place]                                    [place], [place] [phone] [place] [address], [place] [phone]    Dr. Adele Schilder  [phone]   [facility] [place] Dept. 1) [address] [place] 2) [place] 3)  [place] [address], [place] [phone]  [phone]   [place] [phone] or [phone] (After Hours)

## 2013-09-03 NOTE — ED Provider Notes (Signed)
Medical screening examination/treatment/procedure(s) were performed by non-physician practitioner and as supervising physician I was immediately available for consultation/collaboration.  Miko Sirico J. Ahna Konkle, MD 09/03/13 1744 

## 2014-07-15 IMAGING — CR DG ELBOW COMPLETE 3+V*L*
4 series · 4 of 4 positions shown · non-contrast
Comparison: None.

CLINICAL DATA: Left elbow pain/swelling

EXAM:
LEFT ELBOW - COMPLETE 3+ VIEW

[x elbow joint ap left]
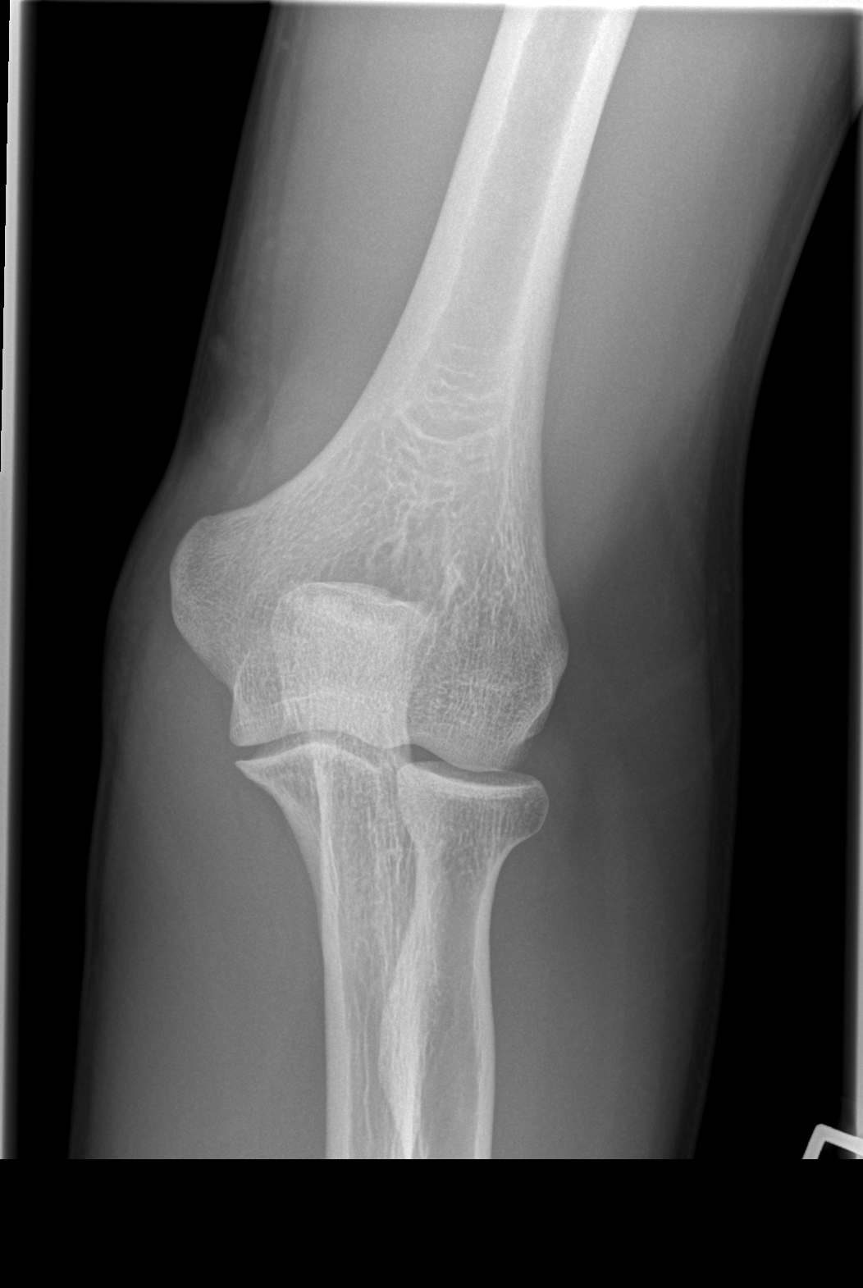

[x elbow joint obl. left (1 of 2)]
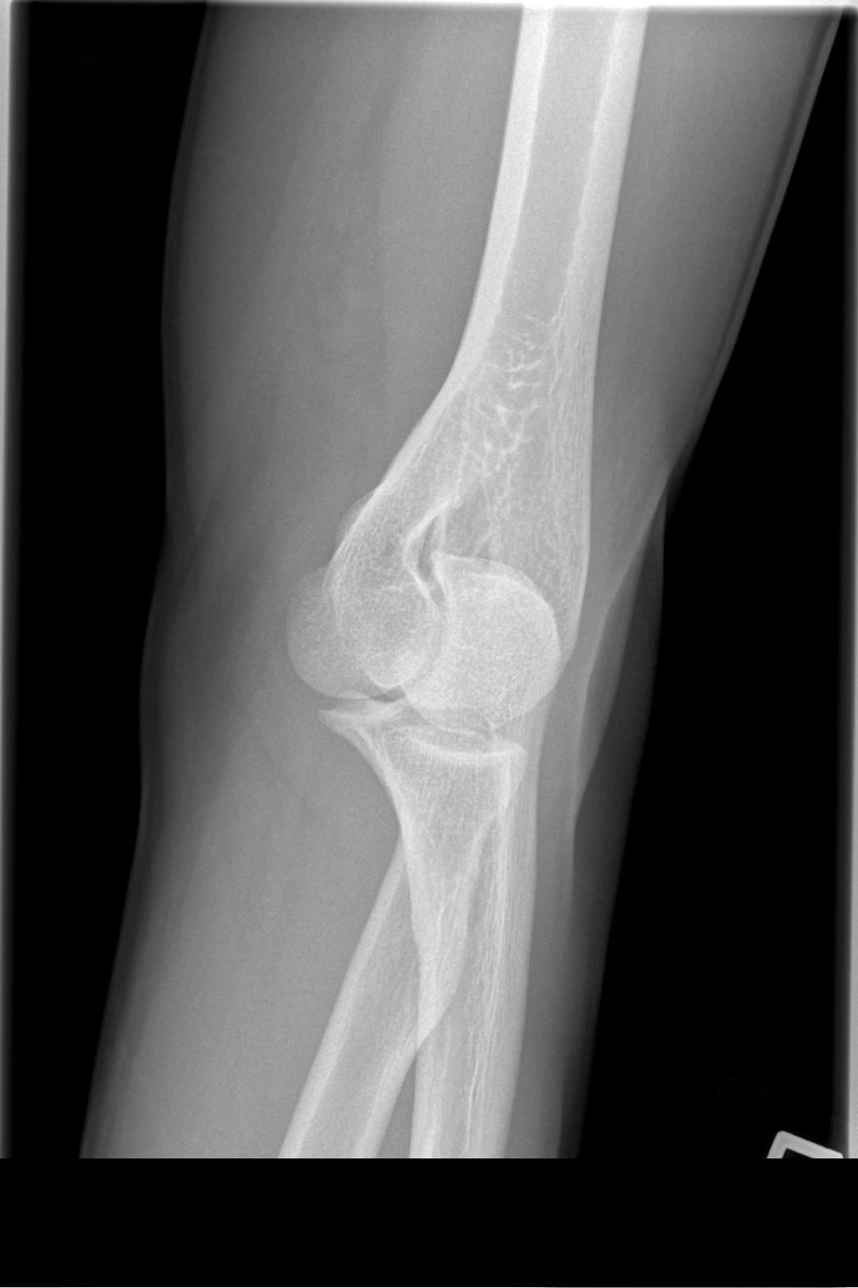

[x elbow joint obl. left (2 of 2)]
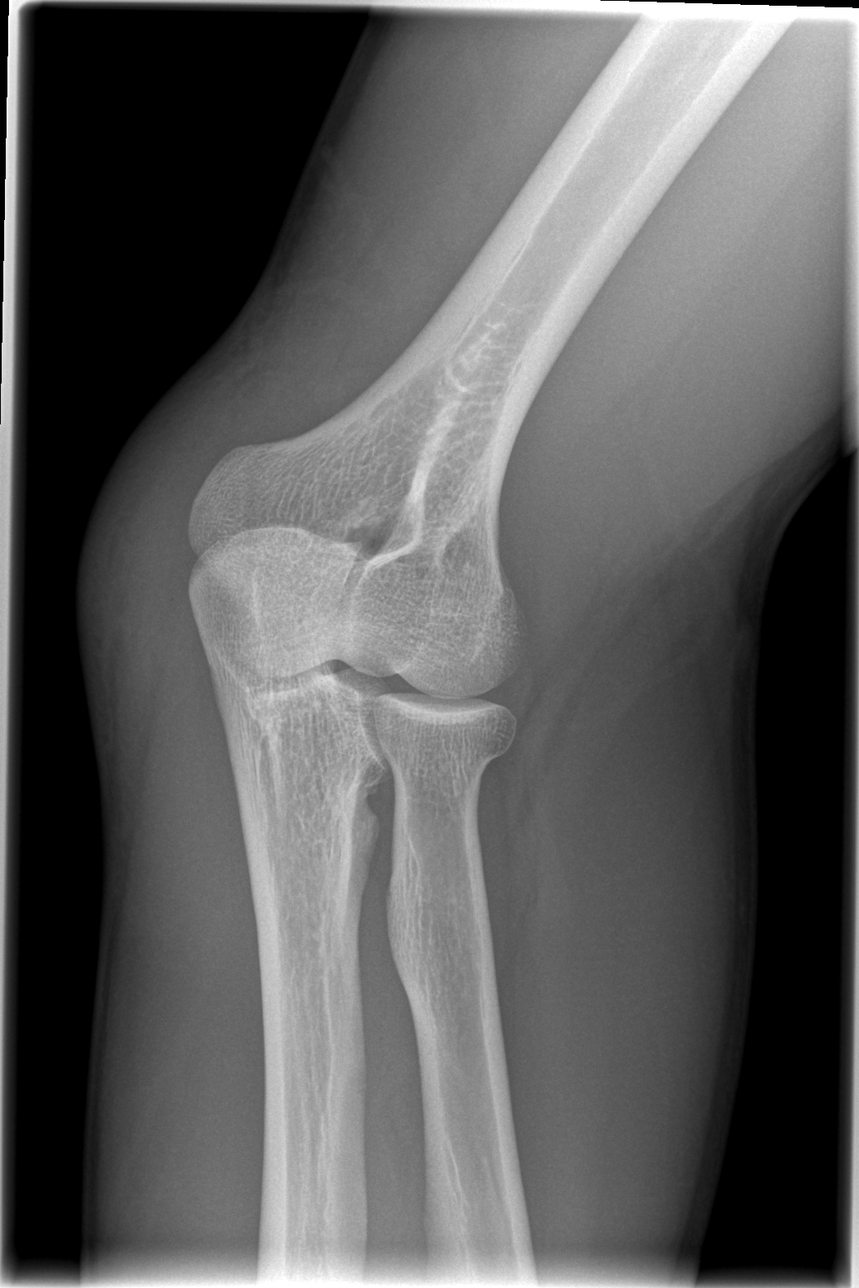

[x elbow joint lat left]
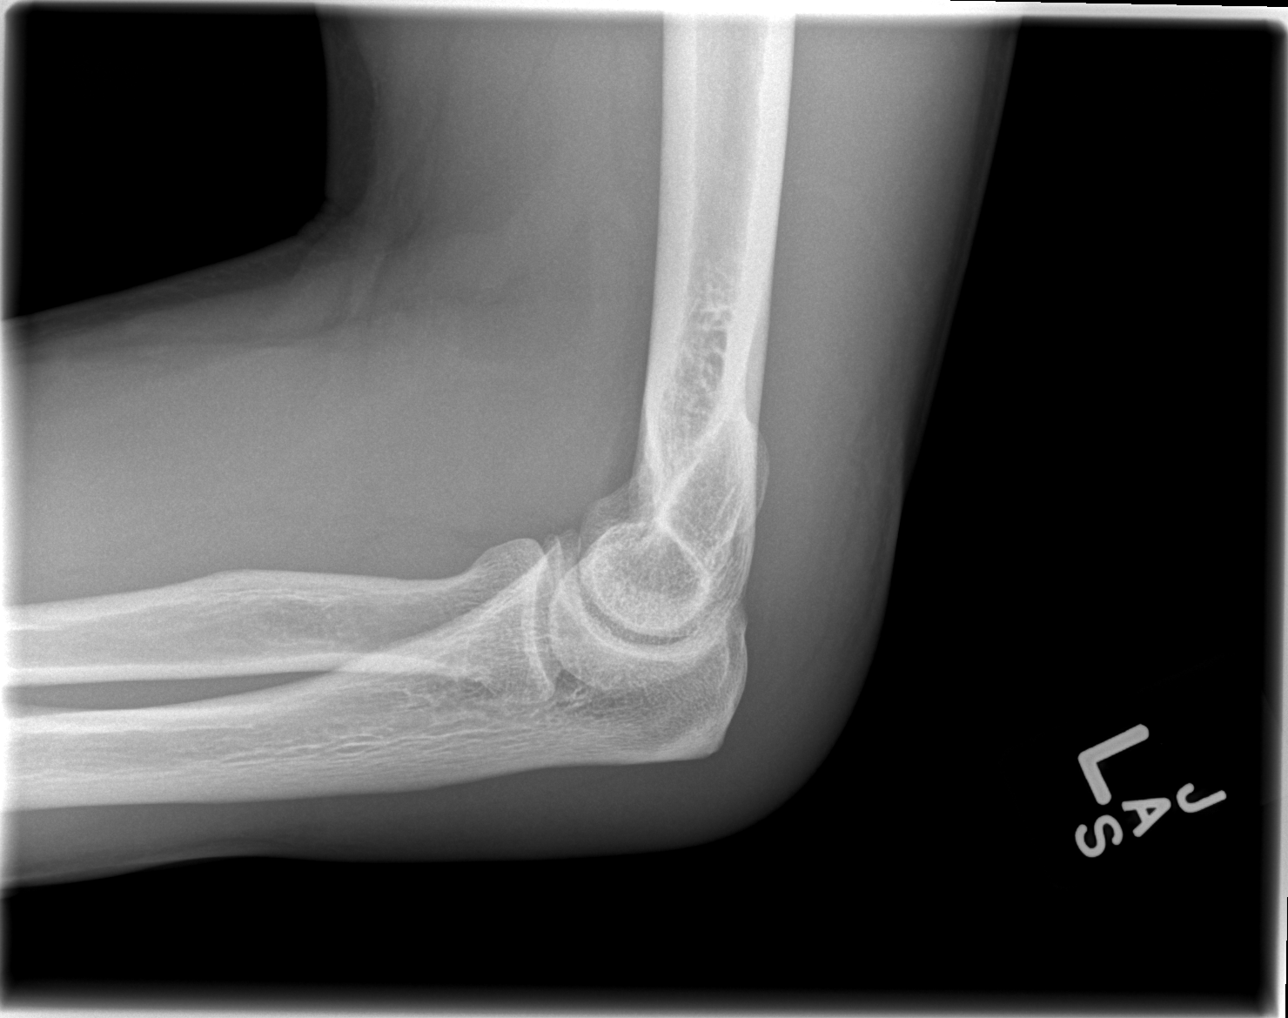

[4 of 4 positions shown; findings below may reference images not displayed]

FINDINGS: No fracture or dislocation is seen.

The joint spaces are preserved.

No displaced quadrant fat pad suggests an elbow joint effusion.

Soft tissue swelling overlying the olecranon, suggesting olecranon
bursitis.
IMPRESSION: No fracture or dislocation is seen.

Soft tissue swelling overlying the olecranon, suggesting olecranon
bursitis.
